# Patient Record
Sex: Female | Born: 2000 | Race: White | Hispanic: No | Marital: Single | Smoking: Never smoker
Health system: Southern US, Community
[De-identification: ages and names within clinical notes are randomized; demographics above are authoritative.]

---
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
## 2019-03-13 ENCOUNTER — Other Ambulatory Visit: Payer: Self-pay | Admitting: Adult Health

## 2019-03-13 ENCOUNTER — Telehealth: Payer: Self-pay | Admitting: Adult Health

## 2019-03-13 MED ORDER — LO LOESTRIN FE 1 MG-10 MCG / 10 MCG PO TABS: 1.0000 | ORAL_TABLET | Freq: Every day | ORAL | 3 refills | Status: DC

## 2019-03-13 NOTE — Telephone Encounter (Signed)
Patient is wanting to discuss changing bc, stated with what she's on she has a cycle every other week.  [employer]  [phone]

## 2019-03-13 NOTE — Telephone Encounter (Signed)
Has missed some pills nad had BTB, try to take every day, will refill lo loestrin.

## 2019-03-13 NOTE — Telephone Encounter (Signed)
Pt is on Lo Loestrin and is having a period every other week. Has missed some pills but pt states when she has missed pills before, it hasn't messed with period. Pt don't have a new sex partner. Has noticed this for about a month. Please advise. Thanks!! [place]

## 2019-05-23 ENCOUNTER — Other Ambulatory Visit: Payer: Self-pay

## 2019-05-23 DIAGNOSIS — Z20822 Contact with and (suspected) exposure to covid-19: Secondary | ICD-10-CM

## 2019-05-24 LAB — NOVEL CORONAVIRUS, NAA: SARS-CoV-2, NAA: NOT DETECTED

## 2019-05-26 ENCOUNTER — Telehealth: Payer: Self-pay | Admitting: General Practice

## 2019-05-26 NOTE — Telephone Encounter (Signed)
Negative COVID results given. Patient results "NOT Detected." Caller expressed understanding. ° °

## 2019-06-30 ENCOUNTER — Telehealth (INDEPENDENT_AMBULATORY_CARE_PROVIDER_SITE_OTHER): Payer: No Typology Code available for payment source | Admitting: Advanced Practice Midwife

## 2019-06-30 ENCOUNTER — Other Ambulatory Visit: Payer: Self-pay

## 2019-06-30 ENCOUNTER — Encounter: Payer: Self-pay | Admitting: Advanced Practice Midwife

## 2019-06-30 VITALS — Ht 65.0 in

## 2019-06-30 DIAGNOSIS — Z308 Encounter for other contraceptive management: Secondary | ICD-10-CM

## 2019-06-30 MED ORDER — MISOPROSTOL 200 MCG PO TABS
ORAL_TABLET | ORAL | 2 refills | Status: DC
Start: 2019-06-30 — End: 2021-05-12

## 2019-06-30 NOTE — Progress Notes (Signed)
   TELEHEALTH VIRTUAL GYNECOLOGY VISIT ENCOUNTER NOTE  I connected with [name] on 06/30/19 at  1:50 PM EST by telephone at home and verified that I am speaking with the correct person using two identifiers.   I discussed the limitations, risks, security and privacy concerns of performing an evaluation and management service by telephone and the availability of in person appointments. I also discussed with the patient that there may be a patient responsible charge related to this service. The patient expressed understanding and agreed to proceed.   History:  Stephanie Rich is a 18 y.o. G0P0000 female being evaluated today for contraception discussion.  She can't remember to take her pills everyday, doesn't want to gain weight, and doesn't want anything in her arm. She is interested in an IUD.  Discussed potential for challenging insertion, will try. Discussed other options, maybe nuva ring if this doesn't work out. SE/Risks/benefits.       History reviewed. No pertinent past medical history. Past Surgical History:  Procedure Laterality Date  . Stephanie Rich EXTRACTION  2019   The following portions of the patient's history were reviewed and updated as appropriate: allergies, current medications, past family history, past medical history, past social history, past surgical history and problem list.     Review of Systems:  Pertinent items noted in HPI and remainder of comprehensive ROS otherwise negative.  Physical Exam:   General:  Alert, oriented and cooperative.   Mental Status: Normal mood and affect perceived. Normal judgment and thought content.  Physical exam deferred due to nature of the encounter  Labs and Imaging No results found for this or any previous visit (from the past 336 hour(s)). No results found.    Assessment and Plan:     Contraception mgt Plan [place] IUD for next period (should be around 12/15).  Condoms until then, knows about plan B.  PRemedicate  w/cytotec.       I discussed the assessment and treatment plan with the patient. The patient was provided an opportunity to ask questions and all were answered. The patient agreed with the plan and demonstrated an understanding of the instructions.   The patient was advised to call back or seek an in-person evaluation/go to the ED if the symptoms worsen or if the condition fails to improve as anticipated.  I provided 15 minutes of non-face-to-face time during this encounter.   Christin Fudge, [place] for [employer], [place]

## 2019-06-30 NOTE — Patient Instructions (Signed)

## 2019-07-24 ENCOUNTER — Other Ambulatory Visit: Payer: Self-pay

## 2019-07-24 ENCOUNTER — Ambulatory Visit (INDEPENDENT_AMBULATORY_CARE_PROVIDER_SITE_OTHER): Payer: No Typology Code available for payment source | Admitting: Advanced Practice Midwife

## 2019-07-24 ENCOUNTER — Encounter: Payer: Self-pay | Admitting: Advanced Practice Midwife

## 2019-07-24 VITALS — BP 116/77 | HR 93 | Ht 65.0 in | Wt 146.0 lb

## 2019-07-24 DIAGNOSIS — Z30430 Encounter for insertion of intrauterine contraceptive device: Secondary | ICD-10-CM

## 2019-07-24 DIAGNOSIS — Z3202 Encounter for pregnancy test, result negative: Secondary | ICD-10-CM

## 2019-07-24 LAB — POCT URINE PREGNANCY: Preg Test, Ur: NEGATIVE

## 2019-07-24 MED ORDER — LEVONORGESTREL 19.5 MCG/DAY IU IUD: INTRAUTERINE_SYSTEM | Freq: Once | INTRAUTERINE | Status: AC

## 2019-07-24 NOTE — Progress Notes (Signed)
Stephanie Rich is a 18 y.o. year old  female G0  who presents for placement of a Liletta IUD. Her LMP was 12/16 and her pregnancy test today is negative.     The risks and benefits of the method and placement have been thouroughly reviewed with the patient and all questions were answered.  Specifically the patient is aware of failure rate of 08/998, expulsion of the IUD and of possible perforation.  The patient is aware of irregular bleeding due to the method and understands the incidence of irregular bleeding diminishes with time.  Time out was performed.  A Graves speculum was placed.  The cervix was prepped using Betadine. The uterus was found to be retroflexed and it sounded to 7 cm.  The cervix was grasped with a tenaculum and the IUD was inserted to 7 cm.  It was pulled back 1 cm and the IUD was disengaged.  The strings were trimmed to 3 cm. When I took the tennaculum off, the IUD was protruding through the cx.  The IUD was removed, strings only about 3cms long, so clearly the IUD did not stay disengaged during inserter removal . Another IUD was placed.   Sonogram was performed and the proper placement of the IUD was verified.  The patient was instructed on signs and symptoms of infection and to check for the strings after each menses or each month.  The patient is to refrain from intercourse for 3 days.  The patient is scheduled for a return appointment after her first menses or 4 weeks.  Christin Fudge 07/24/2019 4:31 PM

## 2019-07-24 NOTE — Addendum Note (Signed)
Addended by: Linton Rump on: 07/24/2019 05:01 PM   Modules accepted: Orders

## 2019-08-21 ENCOUNTER — Encounter: Payer: Self-pay | Admitting: Advanced Practice Midwife

## 2019-08-21 ENCOUNTER — Other Ambulatory Visit: Payer: Self-pay

## 2019-08-21 ENCOUNTER — Ambulatory Visit: Payer: No Typology Code available for payment source | Admitting: Advanced Practice Midwife

## 2019-08-21 VITALS — BP 109/73 | HR 120 | Ht 65.0 in | Wt 144.4 lb

## 2019-08-21 DIAGNOSIS — Z30431 Encounter for routine checking of intrauterine contraceptive device: Secondary | ICD-10-CM

## 2019-08-21 NOTE — Progress Notes (Signed)
History:  19 y.o. G0P0000 here today for today for IUD string check; Liletta IUD was placed  07/23/20. No complaints about the IUD, no concerning side effects.  The following portions of the patient's history were reviewed and updated as appropriate: allergies, current medications, past family history, past medical history, past social history, past surgical history and problem list.  Review of Systems:   Constitutional: Negative for fever and chills Eyes: Negative for visual disturbances Respiratory: Negative for shortness of breath, dyspnea Cardiovascular: Negative for chest pain or palpitations  Gastrointestinal: Negative for vomiting, diarrhea and constipation Genitourinary: Negative for dysuria and urgency Musculoskeletal: Negative for back pain, joint pain, myalgias  Neurological: Negative for dizziness and headaches    Objective:  Physical Exam Last menstrual period 07/23/2019. Gen: NAD Abd: Soft, nontender and nondistended Pelvic: Bedside US reveals properly place IUD. Boyfriend can feel strings, not bothersome  Assessment & Plan:  Normal IUD check. Patient to keep IUD in place for 6 years; can come in for removal if she desires pregnancy.

## 2019-08-30 ENCOUNTER — Encounter: Payer: Self-pay | Admitting: Advanced Practice Midwife

## 2019-09-01 ENCOUNTER — Encounter: Payer: Self-pay | Admitting: Advanced Practice Midwife

## 2019-09-01 ENCOUNTER — Telehealth: Payer: Self-pay | Admitting: *Deleted

## 2019-09-01 NOTE — Telephone Encounter (Signed)
Erroneous encounter

## 2019-09-03 ENCOUNTER — Ambulatory Visit: Payer: Self-pay | Admitting: Obstetrics and Gynecology

## 2020-02-20 ENCOUNTER — Telehealth: Payer: Self-pay | Admitting: Obstetrics & Gynecology

## 2020-02-20 MED ORDER — NITROFURANTOIN MONOHYD MACRO 100 MG PO CAPS: 100.0000 mg | ORAL_CAPSULE | Freq: Two times a day (BID) | ORAL | 0 refills | Status: DC

## 2021-05-12 ENCOUNTER — Other Ambulatory Visit: Payer: Self-pay

## 2021-05-12 ENCOUNTER — Ambulatory Visit: Payer: Self-pay | Admitting: Adult Health

## 2021-05-12 ENCOUNTER — Encounter: Payer: Self-pay | Admitting: Adult Health

## 2021-05-12 VITALS — BP 114/82 | HR 106 | Ht 65.0 in | Wt 171.0 lb

## 2021-05-12 DIAGNOSIS — Z30432 Encounter for removal of intrauterine contraceptive device: Secondary | ICD-10-CM | POA: Insufficient documentation

## 2021-05-12 NOTE — Progress Notes (Signed)
  Subjective:     Patient ID: Stephanie Rich, female   DOB: [birth year], 20 y.o.   MRN: [id number]  HPI Stephanie Rich is a 33 year old white female, single, G0P0, in requesting IUD removal, has bad cramps, irregular periods and pain with sex. She is self pay.    Review of Systems Has bad cramps with IUD Periods irregular with IUD Has pain with sex Reviewed past medical,surgical, social and family history. Reviewed medications and allergies.     Objective:   Physical Exam BP 114/82 (BP Location: Left Arm, Patient Position: Sitting, Cuff Size: Normal)   Pulse (!) 106   Ht 5\' 5"  (1.651 m)   Wt 171 lb (77.6 kg)   LMP  (LMP Unknown)   BMI 28.46 kg/m  Consent signed for IUD removal.   Skin warm and dry.Pelvic: external genitalia is normal in appearance no lesions, vagina: pink and moist,urethra has no lesions or masses noted, cervix:smooth,+IUD strings at os, strings grasped with forceps and pt asked to cough and IUD easily removed,uterus: normal size, shape and contour, non tender, no masses felt, adnexa: no masses or tenderness noted. Bladder is non tender and no masses felt.   Examination chaperoned by LPN  [place] - 05/12/21 1008       Pregnancy Intention Screening   Does the patient want to become pregnant in the next year? No    Does the patient's partner want to become pregnant in the next year? No    Would the patient like to discuss contraceptive options today? No      Contraception Wrap Up   Current Method IUD or IUS    End Method Female Condom    Contraception Counseling Provided No            She is aware of Plan B  Assessment:     1. Encounter for IUD removal    Plan:     Return in about 3 months for pap and physical, will check GC/CHL then  Apply for [facility], or will try to get insurance

## 2021-05-25 ENCOUNTER — Encounter: Payer: Self-pay | Admitting: Obstetrics & Gynecology

## 2021-08-12 ENCOUNTER — Other Ambulatory Visit: Payer: Self-pay | Admitting: Adult Health

## 2022-04-07 ENCOUNTER — Encounter: Payer: Self-pay | Admitting: Obstetrics & Gynecology

## 2022-04-07 ENCOUNTER — Ambulatory Visit (INDEPENDENT_AMBULATORY_CARE_PROVIDER_SITE_OTHER): Payer: Medicaid Other | Admitting: Obstetrics & Gynecology

## 2022-04-07 ENCOUNTER — Other Ambulatory Visit (HOSPITAL_COMMUNITY)
Admission: RE | Admit: 2022-04-07 | Discharge: 2022-04-07 | Disposition: A | Discharge status: Home / Self Care | Payer: Medicaid Other | Source: Ambulatory Visit | Attending: Obstetrics & Gynecology | Admitting: Obstetrics & Gynecology

## 2022-04-07 VITALS — BP 113/74 | HR 81 | Ht 65.0 in | Wt 168.2 lb

## 2022-04-07 DIAGNOSIS — Z30011 Encounter for initial prescription of contraceptive pills: Secondary | ICD-10-CM | POA: Diagnosis not present

## 2022-04-07 DIAGNOSIS — N946 Dysmenorrhea, unspecified: Secondary | ICD-10-CM

## 2022-04-07 DIAGNOSIS — N941 Unspecified dyspareunia: Secondary | ICD-10-CM | POA: Diagnosis not present

## 2022-04-07 DIAGNOSIS — N920 Excessive and frequent menstruation with regular cycle: Secondary | ICD-10-CM

## 2022-04-07 DIAGNOSIS — Z01419 Encounter for gynecological examination (general) (routine) without abnormal findings: Secondary | ICD-10-CM

## 2022-04-07 DIAGNOSIS — Z3009 Encounter for other general counseling and advice on contraception: Secondary | ICD-10-CM | POA: Diagnosis not present

## 2022-04-07 DIAGNOSIS — Z113 Encounter for screening for infections with a predominantly sexual mode of transmission: Secondary | ICD-10-CM | POA: Diagnosis not present

## 2022-04-07 DIAGNOSIS — R102 Pelvic and perineal pain: Secondary | ICD-10-CM

## 2022-04-07 MED ORDER — NORETHIN-ETH ESTRAD-FE BIPHAS 1 MG-10 MCG / 10 MCG PO TABS: 1.0000 | ORAL_TABLET | Freq: Every day | ORAL | 4 refills | Status: DC

## 2022-04-07 NOTE — Progress Notes (Signed)
WELL-WOMAN EXAMINATION Patient name: Stephanie Rich MRN [id number]  Date of birth: 2001/03/02 Chief Complaint:   Gynecologic Exam  History of Present Illness:   Stephanie Rich is a 21 y.o. G0P0000 female being seen today for a routine well-woman exam.   -Concern for endometriosis:  Issues with period since 21yo.  Previously on OCPs; however, difficulty with compliance. Previously had IUD for contraception- removed due to pelvic pain and AUB.    Menses are monthly.  Heavy for the first 2-3 days  changing super tampon every 2hr then "normal" day using tampon every 4 hours.  Notes both dysmenorrhea and cramping not even on her period.  +Dyspareunia- most of the time.  On occasion will take medication, but tries not to.  Also notes fatigue as well as constipation.   -Sexually active using condoms  Patient's last menstrual period was 03/20/2022.  The current method of family planning is condoms.    Last pap to be collected today.  Last mammogram: n/a. Last colonoscopy: n/a     04/07/2022    9:48 AM 08/15/2016    3:53 PM 05/15/2016    4:08 PM  Depression screen PHQ 2/9  Decreased Interest 1 0 0  Down, Depressed, Hopeless 0 0 0  PHQ - 2 Score 1 0 0  Altered sleeping 1 0 0  Tired, decreased energy 2 0 0  Change in appetite 1 0 0  Feeling bad or failure about yourself  0 0 0  Trouble concentrating 1 0 0  Moving slowly or fidgety/restless 0 0 0  Suicidal thoughts 0 0 0  PHQ-9 Score 6 0 0      Review of Systems:   Pertinent items are noted in HPI Denies any headaches, blurred vision, fatigue, shortness of breath, chest pain, abdominal pain, bowel movements, urination, or intercourse unless otherwise stated above.  Pertinent History Reviewed:  Reviewed past medical,surgical, social and family history.  Reviewed problem list, medications and allergies. Physical Assessment:   Vitals:   04/07/22 0942  BP: 113/74  Pulse: 81  Weight: 168 lb 3.2 oz (76.3 kg)  Height: 5'  5" (1.651 m)  Body mass index is 27.99 kg/m.        Physical Examination:   General appearance - well appearing, and in no distress  Mental status - alert, oriented to person, place, and time  Psych:  She has a normal mood and affect  Skin - warm and dry, normal color, no suspicious lesions noted  Chest - effort normal, all lung fields clear to auscultation bilaterally  Heart - normal rate and regular rhythm  Neck:  midline trachea, no thyromegaly or nodules  Breasts - breasts appear normal, no suspicious masses, no skin or nipple changes or  axillary nodes  Abdomen - soft, nontender, nondistended, no masses or organomegaly  Pelvic - VULVA: normal appearing vulva with no masses, tenderness or lesions  VAGINA: normal appearing vagina with normal color and discharge, no lesions  CERVIX: normal appearing cervix without discharge or lesions, no CMT  Thin prep pap is done with HR HPV cotesting  UTERUS: uterus is felt to be normal size, shape, consistency and nontender   ADNEXA: No adnexal masses or tenderness noted.  Extremities:  No swelling or varicosities noted  Chaperone:  pt declined      Assessment & Plan:  1) Well-Woman Exam -pap collected, reviewed ASCCP guidelines  2) Contraceptive management -currently using condoms -plan to start OCPs  3) Pelvic pain, Dyspareunia, HMB-  concerns for endometriosis -Discussed diagnosis of endometriosis and symptoms -reviewed "gold standard" diagnosis vs management of symptoms -discussed concerns regarding future pregnancy -reviewed that until she is actively trying for 86yr would not recommend surgical intervention as women with endmetriosis can become pregnant on their own without intervention -discussed that disease progression/stage of endo does not match infertility  -reviewed fecundity and her concerns about already having infertility -plan for lab work to r/o underlying anemia due to HMB and fatigue   Orders Placed This Encounter   Procedures   CBC   Iron, TIBC and Ferritin Panel    Meds:  Meds ordered this encounter  Medications   Norethindrone-Ethinyl Estradiol-Fe Biphas (LO LOESTRIN FE) 1 MG-10 MCG / 10 MCG tablet    Sig: Take 1 tablet by mouth daily.    Dispense:  90 tablet    Refill:  4    Follow-up: Return in about 1 year (around 04/08/2023) for Annual.   Myna Hidalgo, DO Attending Obstetrician & Gynecologist, [facility] for [facility], [facility]

## 2022-04-17 LAB — CYTOLOGY - PAP
Chlamydia: NEGATIVE
Comment: NEGATIVE
Comment: NEGATIVE
Comment: NORMAL
Diagnosis: UNDETERMINED — AB
High risk HPV: NEGATIVE
Neisseria Gonorrhea: NEGATIVE

## 2022-04-20 ENCOUNTER — Encounter: Payer: Self-pay | Admitting: Obstetrics & Gynecology

## 2022-12-27 ENCOUNTER — Encounter: Payer: Self-pay | Admitting: Obstetrics & Gynecology

## 2022-12-28 ENCOUNTER — Other Ambulatory Visit: Payer: Self-pay | Admitting: Obstetrics & Gynecology

## 2022-12-28 DIAGNOSIS — Z30011 Encounter for initial prescription of contraceptive pills: Secondary | ICD-10-CM

## 2022-12-28 MED ORDER — NORETHINDRONE ACET-ETHINYL EST 1.5-30 MG-MCG PO TABS: 1.0000 | ORAL_TABLET | Freq: Every day | ORAL | 11 refills | Status: DC

## 2022-12-28 NOTE — Progress Notes (Signed)
Change to higher dose OCP  Myna Hidalgo, DO Attending Obstetrician & Gynecologist, [facility] for [employer], [facility]

## 2023-03-05 ENCOUNTER — Encounter: Payer: Self-pay | Admitting: Obstetrics & Gynecology

## 2023-10-17 DIAGNOSIS — Z419 Encounter for procedure for purposes other than remedying health state, unspecified: Secondary | ICD-10-CM | POA: Diagnosis not present

## 2023-10-27 ENCOUNTER — Other Ambulatory Visit: Payer: Self-pay

## 2023-10-27 ENCOUNTER — Inpatient Hospital Stay (HOSPITAL_COMMUNITY)
Admission: AD | Admit: 2023-10-27 | Discharge: 2023-10-27 | Disposition: A | Discharge status: Home / Self Care | Payer: Self-pay | Attending: Obstetrics and Gynecology | Admitting: Obstetrics and Gynecology

## 2023-10-27 ENCOUNTER — Inpatient Hospital Stay (HOSPITAL_COMMUNITY): Payer: Self-pay

## 2023-10-27 DIAGNOSIS — Z3A00 Weeks of gestation of pregnancy not specified: Secondary | ICD-10-CM | POA: Insufficient documentation

## 2023-10-27 DIAGNOSIS — O3680X0 Pregnancy with inconclusive fetal viability, not applicable or unspecified: Secondary | ICD-10-CM

## 2023-10-27 DIAGNOSIS — R1030 Lower abdominal pain, unspecified: Secondary | ICD-10-CM | POA: Diagnosis not present

## 2023-10-27 DIAGNOSIS — O3480 Maternal care for other abnormalities of pelvic organs, unspecified trimester: Secondary | ICD-10-CM | POA: Diagnosis not present

## 2023-10-27 DIAGNOSIS — N939 Abnormal uterine and vaginal bleeding, unspecified: Secondary | ICD-10-CM

## 2023-10-27 DIAGNOSIS — Z3A01 Less than 8 weeks gestation of pregnancy: Secondary | ICD-10-CM

## 2023-10-27 DIAGNOSIS — O209 Hemorrhage in early pregnancy, unspecified: Secondary | ICD-10-CM | POA: Diagnosis not present

## 2023-10-27 DIAGNOSIS — N854 Malposition of uterus: Secondary | ICD-10-CM | POA: Diagnosis not present

## 2023-10-27 LAB — COMPREHENSIVE METABOLIC PANEL
ALT: 18 U/L (ref 0–44)
AST: 17 U/L (ref 15–41)
Albumin: 4 g/dL (ref 3.5–5.0)
Alkaline Phosphatase: 37 U/L — ABNORMAL LOW (ref 38–126)
Anion gap: 8 (ref 5–15)
BUN: 8 mg/dL (ref 6–20)
CO2: 23 mmol/L (ref 22–32)
Calcium: 9 mg/dL (ref 8.9–10.3)
Chloride: 108 mmol/L (ref 98–111)
Creatinine, Ser: 0.7 mg/dL (ref 0.44–1.00)
GFR, Estimated: >60 mL/min (ref >60)
Glucose, Bld: 105 mg/dL — ABNORMAL HIGH (ref 70–99)
Potassium: 3.8 mmol/L (ref 3.5–5.1)
Sodium: 139 mmol/L (ref 135–145)
Total Bilirubin: 0.5 mg/dL (ref 0.0–1.2)
Total Protein: 6.3 g/dL — ABNORMAL LOW (ref 6.5–8.1)

## 2023-10-27 LAB — CBC
HCT: 38.5 % (ref 36.0–46.0)
Hemoglobin: 12.9 g/dL (ref 12.0–15.0)
MCH: 28.7 pg (ref 26.0–34.0)
MCHC: 33.5 g/dL (ref 30.0–36.0)
MCV: 85.6 fL (ref 80.0–100.0)
Platelets: 274 10*3/uL (ref 150–400)
RBC: 4.5 MIL/uL (ref 3.87–5.11)
RDW: 13.1 % (ref 11.5–15.5)
WBC: 7 10*3/uL (ref 4.0–10.5)
nRBC: 0 % (ref 0.0–0.2)

## 2023-10-27 LAB — URINALYSIS, ROUTINE W REFLEX MICROSCOPIC
Bilirubin Urine: NEGATIVE
Glucose, UA: NEGATIVE mg/dL
Hgb urine dipstick: NEGATIVE
Ketones, ur: NEGATIVE mg/dL
Leukocytes,Ua: NEGATIVE
Nitrite: NEGATIVE
Protein, ur: NEGATIVE mg/dL
Specific Gravity, Urine: 1.01 (ref 1.005–1.030)
pH: 7 (ref 5.0–8.0)

## 2023-10-27 LAB — WET PREP, GENITAL
Clue Cells Wet Prep HPF POC: NONE SEEN
Sperm: NONE SEEN
Trich, Wet Prep: NONE SEEN
WBC, Wet Prep HPF POC: <10 (ref <10)
Yeast Wet Prep HPF POC: NONE SEEN

## 2023-10-27 LAB — HCG, QUANTITATIVE, PREGNANCY: hCG, Beta Chain, Quant, S: 15 m[IU]/mL — ABNORMAL HIGH (ref <5)

## 2023-10-27 LAB — POCT PREGNANCY, URINE: Preg Test, Ur: NEGATIVE

## 2023-10-27 MED ORDER — PREPLUS 27-1 MG PO TABS: 1.0000 | ORAL_TABLET | Freq: Every day | ORAL | 13 refills | Status: AC

## 2023-10-27 NOTE — MAU Provider Note (Signed)
 VB, abd cramping, +HPT  S Ms. Stephanie Rich is a 23 y.o. G0P0000 female who presents to MAU today with complaint of +HPT on 3/16 but now with moderate amounts of bright red VB and mild lower abdominal cramping that started 3/21 around 1600. Pt states she had a tampon in her vaginal over night with only minimal soaking however after being roomed her in MAU she saw blood already on pad and tissue like membranes, her first experience with such discharge.  States besides tampon the last coitus or vaginal penetration was over 1week ago.    Receives care at [facility]. Prenatal records reviewed.  Pertinent items noted in HPI and remainder of comprehensive ROS otherwise negative.   O BP (!) 96/46 (BP Location: Right Arm)   Pulse 86   Temp 98.3 F (36.8 C) (Oral)   Resp 18   Ht (P) 5\' 5"  (1.651 m)   Wt (P) 70.4 kg   SpO2 100%   BMI (P) 25.83 kg/m  Physical Exam Vitals and nursing note reviewed. Exam conducted with a chaperone present.  Constitutional:      General: She is not in acute distress.    Appearance: Normal appearance. She is normal weight. She is not ill-appearing.  HENT:     Head: Normocephalic and atraumatic.     Right Ear: External ear normal.     Left Ear: External ear normal.     Nose: Nose normal. No congestion.     Mouth/Throat:     Mouth: Mucous membranes are moist.     Pharynx: Oropharynx is clear.  Eyes:     Extraocular Movements: Extraocular movements intact.     Conjunctiva/sclera: Conjunctivae normal.  Cardiovascular:     Rate and Rhythm: Normal rate.  Pulmonary:     Effort: Pulmonary effort is normal. No respiratory distress.  Abdominal:     General: Abdomen is flat. There is no distension.     Palpations: Abdomen is soft.  Genitourinary:    General: Normal vulva.     Vagina: Vaginal discharge (bright red blood and membranous dc) present.     Comments: External os about FT dilated though cervix appears full and with trailing membranes from external os, unable  to gather any tissues with ring forceps  Musculoskeletal:        General: No swelling. Normal range of motion.     Cervical back: Normal range of motion.  Skin:    General: Skin is warm and dry.  Neurological:     Mental Status: She is alert and oriented to person, place, and time. Mental status is at baseline.     Motor: No weakness.     Gait: Gait normal.  Psychiatric:        Mood and Affect: Mood normal.        Behavior: Behavior normal.      MDM: MAU Course: UA negative, no hematuria  Upreg negative hCG 15  GC collected Wet prep negative   CBC wnl  CMP reassuring  ABO/Rh A+, no need for rhogam   [place] pregnancy of unknown location  AP #Pregnancy of Unknown Location #Hx of +HPT #Vaginal bleeding Concerning for SAB given low hCG as well as membranous tissue collected on admission.  However, given no priors to trend to confirm will have pt get repeat hCG at FT this coming week and sent message to arrange scheduling both lab draw and follow up to discuss ?SAB. Will also send off tissue collected for surgical path.  Discharge from MAU in stable condition with strict/usual precautions Follow up at FT as scheduled for ongoing prenatal care  Allergies as of 10/27/2023       Reactions   Latex Rash        Medication List     STOP taking these medications    Norethindrone Acetate-Ethinyl Estradiol 1.5-30 MG-MCG tablet Commonly known as: Loestrin 1.5/30 (21)        Hessie Dibble, MD 10/27/2023 2:13 PM

## 2023-10-27 NOTE — MAU Note (Signed)
 Stephanie Rich is a 23 y.o. at Unknown here in MAU reporting: multiple positive home pregnancy tests, first one 10/21/2023. Patient reports having small to moderate amounts of bright red vaginal bleeding and mild lower abdominal cramping that started yesterday around 1600.  LMP: 09/21/2023 Onset of complaint: 1600 10/26/2023 Pain score: 1 Vitals:   10/27/23 1153  BP: (!) 96/46  Pulse: 86  Resp: 18  Temp: 98.3 F (36.8 C)  SpO2: 100%   Lab orders placed from triage: urine pregnancy test

## 2023-10-28 LAB — ABO/RH: ABO/RH(D): A POS

## 2023-10-29 ENCOUNTER — Encounter: Payer: Self-pay | Admitting: Adult Health

## 2023-10-29 ENCOUNTER — Ambulatory Visit: Admitting: Adult Health

## 2023-10-29 VITALS — BP 101/63 | HR 67 | Ht 65.0 in | Wt 153.0 lb

## 2023-10-29 DIAGNOSIS — Z3A01 Less than 8 weeks gestation of pregnancy: Secondary | ICD-10-CM | POA: Diagnosis not present

## 2023-10-29 DIAGNOSIS — N939 Abnormal uterine and vaginal bleeding, unspecified: Secondary | ICD-10-CM

## 2023-10-29 DIAGNOSIS — O039 Complete or unspecified spontaneous abortion without complication: Secondary | ICD-10-CM | POA: Diagnosis not present

## 2023-10-29 LAB — GC/CHLAMYDIA PROBE AMP (~~LOC~~) NOT AT ARMC
Chlamydia: NEGATIVE
Comment: NEGATIVE
Comment: NORMAL
Neisseria Gonorrhea: NEGATIVE

## 2023-10-29 NOTE — Progress Notes (Signed)
  Subjective:     Patient ID: Stephanie Rich, female   DOB: [birth year], 23 y.o.   MRN: [id number]  HPI Stephanie Rich is a 23 year old white female, single, G1P0, in for follow up on MAU visit 10/26/23 for vaginal bleeding that started night before and passed tissue, in MAU.  Has first +HPT 10/21/23.     Component Value Date/Time   DIAGPAP (A) 04/07/2022 0939    - Atypical squamous cells of undetermined significance (ASC-US)   HPVHIGH Negative 04/07/2022 0939   ADEQPAP  04/07/2022 0939    Satisfactory for evaluation; transformation zone component PRESENT.     Review of Systems Still bleeding Reviewed past medical,surgical, social and family history. Reviewed medications and allergies.     Objective:   Physical Exam BP 101/63 (BP Location: Right Arm, Patient Position: Sitting, Cuff Size: Normal)   Pulse 67   Ht 5\' 5"  (1.651 m)   Wt 153 lb (69.4 kg)   LMP  (LMP Unknown)   BMI 25.46 kg/m  Skin warm and dry.Lungs: clear to ausculation bilaterally. Cardiovascular: regular rate and rhythm.    Fall risk is low  Upstream - 10/29/23 1336       Pregnancy Intention Screening   Does the patient want to become pregnant in the next year? N/A    Does the patient's partner want to become pregnant in the next year? N/A    Would the patient like to discuss contraceptive options today? No      Contraception Wrap Up   Current Method Pregnant/Seeking Pregnancy    End Method Pregnant/Seeking Pregnancy    Contraception Counseling Provided No             Assessment:     1. Vaginal bleeding (Primary) Bleeding started Friday night, seen in MAU 10/26/23 QHCG was 15, US showed no IUP or ectopic Passed tissue and that was sent to pathology Will check QHCG now and schedule [place] in 10 days for recheck - US PELVIC COMPLETE WITH TRANSVAGINAL; Future - Beta hCG quant (ref lab)  2. Miscarriage ?miscarriage, still bleeding  Blood type A+ Will check QHCG today and get pelvic US in 10 days  - US PELVIC  COMPLETE WITH TRANSVAGINAL; Future - Beta hCG quant (ref lab)     Plan:     Follow up in 10 days in office for pelvic US

## 2023-10-30 LAB — BETA HCG QUANT (REF LAB): hCG Quant: 4 m[IU]/mL

## 2023-10-30 LAB — SURGICAL PATHOLOGY

## 2023-11-08 ENCOUNTER — Telehealth: Payer: Self-pay | Admitting: Obstetrics & Gynecology

## 2023-11-08 ENCOUNTER — Encounter: Payer: Self-pay | Admitting: Obstetrics & Gynecology

## 2023-11-08 NOTE — Telephone Encounter (Signed)
-  Called patient back and reviewed that ultrasound is canceled -She notes that she has not had any further bleeding -Discussed that with following hCG no further workup indicated -Patient is not sure whether she is was not trying for pregnancy however she does have COC's if desired -Reassured patient that next menses may be slightly heavier and more dysmenorrhea -Questions and concerns were addressed -Plan to follow-up as needed  Myna Hidalgo, DO Attending Obstetrician & Gynecologist, [facility] [employer], [facility]

## 2023-11-09 ENCOUNTER — Other Ambulatory Visit

## 2023-11-17 DIAGNOSIS — Z419 Encounter for procedure for purposes other than remedying health state, unspecified: Secondary | ICD-10-CM | POA: Diagnosis not present

## 2023-12-17 DIAGNOSIS — Z419 Encounter for procedure for purposes other than remedying health state, unspecified: Secondary | ICD-10-CM | POA: Diagnosis not present

## 2024-01-17 DIAGNOSIS — Z419 Encounter for procedure for purposes other than remedying health state, unspecified: Secondary | ICD-10-CM | POA: Diagnosis not present

## 2024-02-16 DIAGNOSIS — Z419 Encounter for procedure for purposes other than remedying health state, unspecified: Secondary | ICD-10-CM | POA: Diagnosis not present

## 2024-03-18 DIAGNOSIS — Z419 Encounter for procedure for purposes other than remedying health state, unspecified: Secondary | ICD-10-CM | POA: Diagnosis not present

## 2024-04-18 DIAGNOSIS — Z419 Encounter for procedure for purposes other than remedying health state, unspecified: Secondary | ICD-10-CM | POA: Diagnosis not present

## 2024-06-04 ENCOUNTER — Ambulatory Visit

## 2024-06-04 DIAGNOSIS — Z3201 Encounter for pregnancy test, result positive: Secondary | ICD-10-CM

## 2024-06-04 LAB — POCT URINE PREGNANCY: Preg Test, Ur: POSITIVE — AB

## 2024-06-04 NOTE — Progress Notes (Signed)
   NURSE VISIT- PREGNANCY CONFIRMATION   SUBJECTIVE:  Stephanie Rich is a 23 y.o. G1P0000 female at [redacted]w[redacted]d by certain LMP of Patient's last menstrual period was 04/24/2024. Here for pregnancy confirmation.  Home pregnancy test: positive x 3  She reports no complaints.  She is taking prenatal vitamins.    OBJECTIVE:  LMP 04/24/2024   Appears well, in no apparent distress  No results found for this or any previous visit (from the past 24 hours).  ASSESSMENT: Positive pregnancy test, [redacted]w[redacted]d by LMP    PLAN: Schedule for dating ultrasound in 2 weeks Prenatal vitamins: continue   Nausea medicines: not currently needed   OB packet given: No  Alan LITTIE Fischer  06/04/2024 10:01 AM

## 2024-06-18 ENCOUNTER — Ambulatory Visit (INDEPENDENT_AMBULATORY_CARE_PROVIDER_SITE_OTHER)

## 2024-06-18 DIAGNOSIS — O3680X0 Pregnancy with inconclusive fetal viability, not applicable or unspecified: Secondary | ICD-10-CM | POA: Diagnosis not present

## 2024-06-18 DIAGNOSIS — N939 Abnormal uterine and vaginal bleeding, unspecified: Secondary | ICD-10-CM

## 2024-06-18 DIAGNOSIS — O039 Complete or unspecified spontaneous abortion without complication: Secondary | ICD-10-CM

## 2024-06-18 DIAGNOSIS — Z3A01 Less than 8 weeks gestation of pregnancy: Secondary | ICD-10-CM

## 2024-06-18 NOTE — Progress Notes (Signed)
 US  7+6 wks,single IUP with yolk sac,FHR 155 bpm,normal ovaries,CRL 13.54 mm

## 2024-06-23 ENCOUNTER — Ambulatory Visit: Payer: Self-pay | Admitting: Adult Health

## 2024-07-23 ENCOUNTER — Other Ambulatory Visit (HOSPITAL_COMMUNITY)
Admission: RE | Admit: 2024-07-23 | Discharge: 2024-07-23 | Disposition: A | Source: Ambulatory Visit | Attending: Women's Health | Admitting: Women's Health

## 2024-07-23 ENCOUNTER — Encounter: Payer: Self-pay | Admitting: Women's Health

## 2024-07-23 ENCOUNTER — Encounter: Admitting: *Deleted

## 2024-07-23 ENCOUNTER — Ambulatory Visit: Admitting: Women's Health

## 2024-07-23 VITALS — BP 112/77 | HR 98 | Wt 163.0 lb

## 2024-07-23 DIAGNOSIS — Z3490 Encounter for supervision of normal pregnancy, unspecified, unspecified trimester: Secondary | ICD-10-CM | POA: Insufficient documentation

## 2024-07-23 DIAGNOSIS — Z8742 Personal history of other diseases of the female genital tract: Secondary | ICD-10-CM | POA: Insufficient documentation

## 2024-07-23 DIAGNOSIS — Z3A12 12 weeks gestation of pregnancy: Secondary | ICD-10-CM | POA: Diagnosis not present

## 2024-07-23 DIAGNOSIS — Z113 Encounter for screening for infections with a predominantly sexual mode of transmission: Secondary | ICD-10-CM | POA: Diagnosis not present

## 2024-07-23 DIAGNOSIS — Z131 Encounter for screening for diabetes mellitus: Secondary | ICD-10-CM

## 2024-07-23 DIAGNOSIS — Z3480 Encounter for supervision of other normal pregnancy, unspecified trimester: Secondary | ICD-10-CM

## 2024-07-23 DIAGNOSIS — Z1332 Encounter for screening for maternal depression: Secondary | ICD-10-CM

## 2024-07-23 DIAGNOSIS — Z8759 Personal history of other complications of pregnancy, childbirth and the puerperium: Secondary | ICD-10-CM | POA: Diagnosis not present

## 2024-07-23 DIAGNOSIS — Z3481 Encounter for supervision of other normal pregnancy, first trimester: Secondary | ICD-10-CM | POA: Diagnosis not present

## 2024-07-23 NOTE — Progress Notes (Signed)
 INITIAL OBSTETRICAL VISIT Patient name: Stephanie Rich MRN [id number]  Date of birth: [birth year] Chief Complaint:   Initial Prenatal Visit  History of Present Illness:   Stephanie Rich is a 23 y.o. G30P0010 Caucasian female at [redacted]w[redacted]d by LMP c/w u/s at 7 weeks with an Estimated Date of Delivery: 01/29/25 being seen today for her initial obstetrical visit.   Patient's last menstrual period was 04/24/2024. Her obstetrical history is significant for SAB x 1.   Today she reports no complaints.  Last pap 04/07/22. Results were: ASCUS w/ HRHPV negative     07/23/2024    9:44 AM 04/07/2022    9:48 AM 08/15/2016    3:53 PM 05/15/2016    4:08 PM  Depression screen PHQ 2/9  Decreased Interest 0 1 0 0  Down, Depressed, Hopeless 0 0 0 0  PHQ - 2 Score 0 1 0 0  Altered sleeping 0 1 0 0  Tired, decreased energy 0 2 0 0  Change in appetite 0 1 0 0  Feeling bad or failure about yourself  0 0 0 0  Trouble concentrating 0 1 0 0  Moving slowly or fidgety/restless 0 0 0 0  Suicidal thoughts 0 0 0  0   PHQ-9 Score 0 6  0  0      Data saved with a previous flowsheet row definition        07/23/2024    9:44 AM 04/07/2022    9:48 AM  GAD 7 : Generalized Anxiety Score  Nervous, Anxious, on Edge 0 1  Control/stop worrying 0 1  Worry too much - different things 0 1  Trouble relaxing 0 1  Restless 0 1  Easily annoyed or irritable 0 1  Afraid - awful might happen 0 0  Total GAD 7 Score 0 6     Review of Systems:   Pertinent items are noted in HPI Denies cramping/contractions, leakage of fluid, vaginal bleeding, abnormal vaginal discharge w/ itching/odor/irritation, headaches, visual changes, shortness of breath, chest pain, abdominal pain, severe nausea/vomiting, or problems with urination or bowel movements unless otherwise stated above.  Pertinent History Reviewed:  Reviewed past medical,surgical, social, obstetrical and family history.  Reviewed problem list, medications and allergies. OB  History  Gravida Para Term Preterm AB Living  2 0 0 0 1 0  SAB IAB Ectopic Multiple Live Births  1 0 0 0 0    # Outcome Date GA Lbr Len/2nd Weight Sex Type Anes PTL Lv  2 Current           1 SAB 10/30/23           Physical Assessment:   Vitals:   07/23/24 0934  BP: 112/77  Pulse: 98  Weight: 163 lb (73.9 kg)  Body mass index is 27.12 kg/m.       Physical Examination:  General appearance - well appearing, and in no distress  Mental status - alert, oriented to person, place, and time  Psych:  She has a normal mood and affect  Skin - warm and dry, normal color, no suspicious lesions noted  Chest - effort normal, all lung fields clear to auscultation bilaterally  Heart - normal rate and regular rhythm  Abdomen - soft, nontender  Extremities:  No swelling or varicosities noted  Thin prep pap is not done   Chaperone: N/A  TODAY'S informal TA u/s: +FCA and active fetus  No results found for this or any previous visit (from  the past 24 hours).  Assessment & Plan:  1) Low-Risk Pregnancy G2P0010 at [redacted]w[redacted]d with an Estimated Date of Delivery: 01/29/25   2) Initial OB visit  Meds: No orders of the defined types were placed in this encounter.   Initial labs obtained Continue prenatal vitamins Reviewed n/v relief measures and warning s/s to report Reviewed recommended weight gain based on pre-gravid BMI Encouraged well-balanced diet Genetic & carrier screening discussed: requests Panorama, AFP, and Horizon  Ultrasound discussed; fetal survey: requested CCNC completed> form faxed if has or is planning to apply for medicaid The nature of [facility] [employer] with multiple MDs and other Advanced Practice Providers was explained to patient; also emphasized that fellows, residents, and students are part of our team. Does not have home bp cuff. Office bp cuff given: no. Rx sent: yes. Check bp weekly, let us  know if consistently >140/90.   Follow-up: Return in  about 4 weeks (around 08/20/2024) for LROB, AFP, CNM, in person; then @ 20w for anatomy u/s and LROB w/ CNM.   Orders Placed This Encounter  Procedures   Urine Culture   CBC/D/Plt+RPR+Rh+ABO+RubIgG...   Hemoglobin A1c   PANORAMA PRENATAL TEST   HORIZON CUSTOM    Suzen JONELLE Fetters CNM, [facility] 07/23/2024 10:13 AM

## 2024-07-23 NOTE — Patient Instructions (Signed)
Allyssa, thank you for choosing our office today! We appreciate the opportunity to meet your healthcare needs. You may receive a short survey by mail, e-mail, or through MyChart. If you are happy with your care we would appreciate if you could take just a few minutes to complete the survey questions. We read all of your comments and take your feedback very seriously. Thank you again for choosing our office.  [facility] ([address] [place], [place] [address]) Entrance C, located off of [address] Free 24/7 valet parking   Nausea & Vomiting Have saltine crackers or pretzels by your bed and eat a few bites before you raise your head out of bed in the morning Eat small frequent meals throughout the day instead of large meals Drink plenty of fluids throughout the day to stay hydrated, just don't drink a lot of fluids with your meals.  This can make your stomach fill up faster making you feel sick Do not brush your teeth right after you eat Products with real ginger are good for nausea, like ginger ale and ginger hard candy Make sure it says made with real ginger! Sucking on sour candy like lemon heads is also good for nausea If your prenatal vitamins make you nauseated, take them at night so you will sleep through the nausea Sea Bands If you feel like you need medicine for the nausea & vomiting please let us know If you are unable to keep any fluids or food down please let us know   Constipation Drink plenty of fluid, preferably water, throughout the day Eat foods high in fiber such as fruits, vegetables, and grains Exercise, such as walking, is a good way to keep your bowels regular Drink warm fluids, especially warm prune juice, or decaf coffee Eat a 1/2 cup of real oatmeal (not instant), 1/2 cup applesauce, and 1/2-1 cup warm prune juice every day If needed, you may take Colace (docusate sodium) stool softener  once or twice a day to help keep the stool soft.  If you still are having problems with constipation, you may take Miralax once daily as needed to help keep your bowels regular.   Home Blood Pressure Monitoring for Patients   Your provider has recommended that you check your blood pressure (BP) at least once a week at home. If you do not have a blood pressure cuff at home, one will be provided for you. Contact your provider if you have not received your monitor within 1 week.   Helpful Tips for Accurate Home Blood Pressure Checks  Don't smoke, exercise, or drink caffeine 30 minutes before checking your BP Use the restroom before checking your BP (a full bladder can raise your pressure) Relax in a comfortable upright chair Feet on the ground Left arm resting comfortably on a flat surface at the level of your heart Legs uncrossed Back supported Sit quietly and don't talk Place the cuff on your bare arm Adjust snuggly, so that only two fingertips can fit between your skin and the top of the cuff Check 2 readings separated by at least one minute Keep a log of your BP readings For a visual, please reference this diagram: http://ccnc.care/bpdiagram  Provider Name: [facility]     Phone: [phone]  Zone 1: ALL CLEAR  Continue to monitor your symptoms:  BP reading is less than 140 (top number) or less than 90 (bottom   number)  No right upper stomach pain No headaches or seeing spots No feeling nauseated or throwing up No swelling in face and hands  Zone 2: CAUTION Call your doctor's office for any of the following:  BP reading is greater than 140 (top number) or greater than 90 (bottom number)  Stomach pain under your ribs in the middle or right side Headaches or seeing spots Feeling nauseated or throwing up Swelling in face and hands  Zone 3: EMERGENCY  Seek immediate medical care if you have any of the following:  BP reading is greater than160 (top number) or greater than  110 (bottom number) Severe headaches not improving with Tylenol Serious difficulty catching your breath Any worsening symptoms from Zone 2    First Trimester of Pregnancy The first trimester of pregnancy is from week 1 until the end of week 12 (months 1 through 3). A week after a sperm fertilizes an egg, the egg will implant on the wall of the uterus. This embryo will begin to develop into a baby. Genes from you and your partner are forming the baby. The female genes determine whether the baby is a boy or a girl. At 6-8 weeks, the eyes and face are formed, and the heartbeat can be seen on ultrasound. At the end of 12 weeks, all the baby's organs are formed.  Now that you are pregnant, you will want to do everything you can to have a healthy baby. Two of the most important things are to get good prenatal care and to follow your health care provider's instructions. Prenatal care is all the medical care you receive before the baby's birth. This care will help prevent, find, and treat any problems during the pregnancy and childbirth. BODY CHANGES Your body goes through many changes during pregnancy. The changes vary from woman to woman.  You may gain or lose a couple of pounds at first. You may feel sick to your stomach (nauseous) and throw up (vomit). If the vomiting is uncontrollable, call your health care provider. You may tire easily. You may develop headaches that can be relieved by medicines approved by your health care provider. You may urinate more often. Painful urination may mean you have a bladder infection. You may develop heartburn as a result of your pregnancy. You may develop constipation because certain hormones are causing the muscles that push waste through your intestines to slow down. You may develop hemorrhoids or swollen, bulging veins (varicose veins). Your breasts may begin to grow larger and become tender. Your nipples may stick out more, and the tissue that surrounds them  (areola) may become darker. Your gums may bleed and may be sensitive to brushing and flossing. Dark spots or blotches (chloasma, mask of pregnancy) may develop on your face. This will likely fade after the baby is born. Your menstrual periods will stop. You may have a loss of appetite. You may develop cravings for certain kinds of food. You may have changes in your emotions from day to day, such as being excited to be pregnant or being concerned that something may go wrong with the pregnancy and baby. You may have more vivid and strange dreams. You may have changes in your hair. These can include thickening of your hair, rapid growth, and changes in texture. Some women also have hair loss during or after pregnancy, or hair that feels dry or thin. Your hair will most likely return to normal after your baby is born. WHAT TO EXPECT AT YOUR PRENATAL   VISITS During a routine prenatal visit: You will be weighed to make sure you and the baby are growing normally. Your blood pressure will be taken. Your abdomen will be measured to track your baby's growth. The fetal heartbeat will be listened to starting around week 10 or 12 of your pregnancy. Test results from any previous visits will be discussed. Your health care provider may ask you: How you are feeling. If you are feeling the baby move. If you have had any abnormal symptoms, such as leaking fluid, bleeding, severe headaches, or abdominal cramping. If you have any questions. Other tests that may be performed during your first trimester include: Blood tests to find your blood type and to check for the presence of any previous infections. They will also be used to check for low iron levels (anemia) and Rh antibodies. Later in the pregnancy, blood tests for diabetes will be done along with other tests if problems develop. Urine tests to check for infections, diabetes, or protein in the urine. An ultrasound to confirm the proper growth and development  of the baby. An amniocentesis to check for possible genetic problems. Fetal screens for spina bifida and Down syndrome. You may need other tests to make sure you and the baby are doing well. HOME CARE INSTRUCTIONS  Medicines Follow your health care provider's instructions regarding medicine use. Specific medicines may be either safe or unsafe to take during pregnancy. Take your prenatal vitamins as directed. If you develop constipation, try taking a stool softener if your health care provider approves. Diet Eat regular, well-balanced meals. Choose a variety of foods, such as meat or vegetable-based protein, fish, milk and low-fat dairy products, vegetables, fruits, and whole grain breads and cereals. Your health care provider will help you determine the amount of weight gain that is right for you. Avoid raw meat and uncooked cheese. These carry germs that can cause birth defects in the baby. Eating four or five small meals rather than three large meals a day may help relieve nausea and vomiting. If you start to feel nauseous, eating a few soda crackers can be helpful. Drinking liquids between meals instead of during meals also seems to help nausea and vomiting. If you develop constipation, eat more high-fiber foods, such as fresh vegetables or fruit and whole grains. Drink enough fluids to keep your urine clear or pale yellow. Activity and Exercise Exercise only as directed by your health care provider. Exercising will help you: Control your weight. Stay in shape. Be prepared for labor and delivery. Experiencing pain or cramping in the lower abdomen or low back is a good sign that you should stop exercising. Check with your health care provider before continuing normal exercises. Try to avoid standing for long periods of time. Move your legs often if you must stand in one place for a long time. Avoid heavy lifting. Wear low-heeled shoes, and practice good posture. You may continue to have sex  unless your health care provider directs you otherwise. Relief of Pain or Discomfort Wear a good support bra for breast tenderness.   Take warm sitz baths to soothe any pain or discomfort caused by hemorrhoids. Use hemorrhoid cream if your health care provider approves.   Rest with your legs elevated if you have leg cramps or low back pain. If you develop varicose veins in your legs, wear support hose. Elevate your feet for 15 minutes, 3-4 times a day. Limit salt in your diet. Prenatal Care Schedule your prenatal visits by the   twelfth week of pregnancy. They are usually scheduled monthly at first, then more often in the last 2 months before delivery. Write down your questions. Take them to your prenatal visits. Keep all your prenatal visits as directed by your health care provider. Safety Wear your seat belt at all times when driving. Make a list of emergency phone numbers, including numbers for family, friends, the hospital, and police and fire departments. General Tips Ask your health care provider for a referral to a local prenatal education class. Begin classes no later than at the beginning of month 6 of your pregnancy. Ask for help if you have counseling or nutritional needs during pregnancy. Your health care provider can offer advice or refer you to specialists for help with various needs. Do not use hot tubs, steam rooms, or saunas. Do not douche or use tampons or scented sanitary pads. Do not cross your legs for long periods of time. Avoid cat litter boxes and soil used by cats. These carry germs that can cause birth defects in the baby and possibly loss of the fetus by miscarriage or stillbirth. Avoid all smoking, herbs, alcohol, and medicines not prescribed by your health care provider. Chemicals in these affect the formation and growth of the baby. Schedule a dentist appointment. At home, brush your teeth with a soft toothbrush and be gentle when you floss. SEEK MEDICAL CARE IF:   You have dizziness. You have mild pelvic cramps, pelvic pressure, or nagging pain in the abdominal area. You have persistent nausea, vomiting, or diarrhea. You have a bad smelling vaginal discharge. You have pain with urination. You notice increased swelling in your face, hands, legs, or ankles. SEEK IMMEDIATE MEDICAL CARE IF:  You have a fever. You are leaking fluid from your vagina. You have spotting or bleeding from your vagina. You have severe abdominal cramping or pain. You have rapid weight gain or loss. You vomit blood or material that looks like coffee grounds. You are exposed to German measles and have never had them. You are exposed to fifth disease or chickenpox. You develop a severe headache. You have shortness of breath. You have any kind of trauma, such as from a fall or a car accident. Document Released: 07/18/2001 Document Revised: 12/08/2013 Document Reviewed: 06/03/2013 [employer] Patient Information 2015 [employer]. This information is not intended to replace advice given to you by your health care provider. Make sure you discuss any questions you have with your health care provider.  

## 2024-07-24 LAB — CBC/D/PLT+RPR+RH+ABO+RUBIGG...
Antibody Screen: NEGATIVE
Basophils Absolute: 0 x10E3/uL (ref 0.0–0.2)
Basos: 0 %
EOS (ABSOLUTE): 0.1 x10E3/uL (ref 0.0–0.4)
Eos: 1 %
HCV Ab: NONREACTIVE
HIV Screen 4th Generation wRfx: NONREACTIVE
Hematocrit: 40.4 % (ref 34.0–46.6)
Hemoglobin: 13.4 g/dL (ref 11.1–15.9)
Hepatitis B Surface Ag: NEGATIVE
Immature Grans (Abs): 0 x10E3/uL (ref 0.0–0.1)
Immature Granulocytes: 0 %
Lymphocytes Absolute: 1.7 x10E3/uL (ref 0.7–3.1)
Lymphs: 22 %
MCH: 28.9 pg (ref 26.6–33.0)
MCHC: 33.2 g/dL (ref 31.5–35.7)
MCV: 87 fL (ref 79–97)
Monocytes Absolute: 0.5 x10E3/uL (ref 0.1–0.9)
Monocytes: 6 %
Neutrophils Absolute: 5.5 x10E3/uL (ref 1.4–7.0)
Neutrophils: 71 %
Platelets: 247 x10E3/uL (ref 150–450)
RBC: 4.64 x10E6/uL (ref 3.77–5.28)
RDW: 12.6 % (ref 11.7–15.4)
RPR Ser Ql: NONREACTIVE
Rh Factor: POSITIVE
Rubella Antibodies, IGG: 1.36 {index} (ref >0.99)
WBC: 7.9 x10E3/uL (ref 3.4–10.8)

## 2024-07-24 LAB — HCV INTERPRETATION

## 2024-07-24 LAB — HEMOGLOBIN A1C
Est. average glucose Bld gHb Est-mCnc: 97 mg/dL
Hgb A1c MFr Bld: 5 % (ref 4.8–5.6)

## 2024-07-24 LAB — CERVICOVAGINAL ANCILLARY ONLY
Chlamydia: NEGATIVE
Comment: NEGATIVE
Comment: NORMAL
Neisseria Gonorrhea: NEGATIVE

## 2024-07-25 LAB — URINE CULTURE

## 2024-07-29 ENCOUNTER — Ambulatory Visit: Payer: Self-pay | Admitting: Women's Health

## 2024-07-29 LAB — PANORAMA PRENATAL TEST FULL PANEL:PANORAMA TEST PLUS 5 ADDITIONAL MICRODELETIONS: FETAL FRACTION: 11.4

## 2024-08-02 LAB — HORIZON CUSTOM: REPORT SUMMARY: NEGATIVE

## 2024-08-20 ENCOUNTER — Encounter: Payer: Self-pay | Admitting: Women's Health

## 2024-08-20 ENCOUNTER — Ambulatory Visit (INDEPENDENT_AMBULATORY_CARE_PROVIDER_SITE_OTHER): Admitting: Women's Health

## 2024-08-20 VITALS — BP 100/64 | HR 84 | Wt 173.0 lb

## 2024-08-20 DIAGNOSIS — O99891 Other specified diseases and conditions complicating pregnancy: Secondary | ICD-10-CM

## 2024-08-20 DIAGNOSIS — Z3A16 16 weeks gestation of pregnancy: Secondary | ICD-10-CM

## 2024-08-20 DIAGNOSIS — R3989 Other symptoms and signs involving the genitourinary system: Secondary | ICD-10-CM | POA: Diagnosis not present

## 2024-08-20 DIAGNOSIS — Z1379 Encounter for other screening for genetic and chromosomal anomalies: Secondary | ICD-10-CM

## 2024-08-20 DIAGNOSIS — R8290 Unspecified abnormal findings in urine: Secondary | ICD-10-CM

## 2024-08-20 DIAGNOSIS — Z3480 Encounter for supervision of other normal pregnancy, unspecified trimester: Secondary | ICD-10-CM

## 2024-08-20 DIAGNOSIS — Z363 Encounter for antenatal screening for malformations: Secondary | ICD-10-CM

## 2024-08-20 DIAGNOSIS — Z3482 Encounter for supervision of other normal pregnancy, second trimester: Secondary | ICD-10-CM

## 2024-08-20 NOTE — Patient Instructions (Signed)
Stephanie Rich, thank you for choosing our office today! We appreciate the opportunity to meet your healthcare needs. You may receive a short survey by mail, e-mail, or through [employer]. If you are happy with your care we would appreciate if you could take just a few minutes to complete the survey questions. We read all of your comments and take your feedback very seriously. Thank you again for choosing our office.  Center for [employer] Team at [place] at [facility] ([place], [place] [address]) Entrance C, located off of [address] parking  Go to [email or web address] to register for FREE online childbirth classes  Call the office [phone]) or go to [facility] if: You begin to severe cramping Your water breaks.  Sometimes it is a big gush of fluid, sometimes it is just a trickle that keeps getting your panties wet or running down your legs You have vaginal bleeding.  It is normal to have a small amount of spotting if your cervix was checked.   [facility] Pediatricians/Family Doctors [facility]): [address] [place], [facility]: [address] [place], [phone]                [facility]): [place], [phone] (call to ask if accepting patients) [facility] Department: [address], [place], [place] Pediatricians/Family Doctors [facility]): [place] [place], [address], [place] [facility]: [address] [place], [place] [facility]: [place], [place]): [phone] [facility]: [address] [place]: [address] [place] [facility]: [phone], [phone]  Home Blood Pressure Monitoring for Patients   Your provider has recommended that you check your blood pressure (BP) at least once a week at home. If you do not have a blood pressure cuff at home, one will be provided for you. Contact your provider if you have not received your monitor within 1 week.   Helpful Tips for Accurate Home Blood Pressure Checks  Don't smoke, exercise, or drink caffeine 30 minutes before checking your BP Use the restroom before checking your BP (a full bladder can raise your pressure) Relax in a comfortable upright chair Feet on the ground Left arm resting comfortably on a flat surface at the level of your heart Legs uncrossed Back supported Sit quietly and don't talk Place the cuff on your bare arm Adjust snuggly, so that only two fingertips can fit between your skin and the top of the cuff Check 2 readings separated by at least one minute Keep a log of your BP readings For a visual, please reference this diagram: http://ccnc.care/bpdiagram  Provider Name: [facility]     Phone: [phone]  Zone 1: ALL CLEAR  Continue to monitor your symptoms:  BP reading is less than 140 (top number) or less than 90 (bottom number)  No right upper stomach pain No headaches or seeing spots No feeling nauseated or throwing up No swelling in face and hands  Zone 2: CAUTION Call your doctor's office for any of the following:  BP reading is greater than 140 (top number) or greater than  90 (bottom number)  Stomach pain under your ribs in the middle or right side Headaches or seeing spots Feeling nauseated or throwing up Swelling in face and hands  Zone 3: EMERGENCY  Seek immediate medical care if you have any of the following:  BP reading is greater than160 (top number) or greater than 110 (bottom number) Severe headaches not improving with Tylenol Serious difficulty catching your breath Any worsening symptoms from  Zone 2     Second Trimester of Pregnancy The second trimester is from week 14 through week 27 (months 4 through 6). The second trimester is often a time when you feel your best. Your body has adjusted to being pregnant, and you begin to feel better physically. Usually, morning sickness has lessened or quit completely, you may have more energy, and you may have an increase in appetite. The second trimester is also a time when the fetus is growing rapidly. At the end of the sixth month, the fetus is about 9 inches long and weighs about 1 pounds. You will likely begin to feel the baby move (quickening) between 16 and 20 weeks of pregnancy. Body changes during your second trimester Your body continues to go through many changes during your second trimester. The changes vary from woman to woman. Your weight will continue to increase. You will notice your lower abdomen bulging out. You may begin to get stretch marks on your hips, abdomen, and breasts. You may develop headaches that can be relieved by medicines. The medicines should be approved by your health care provider. You may urinate more often because the fetus is pressing on your bladder. You may develop or continue to have heartburn as a result of your pregnancy. You may develop constipation because certain hormones are causing the muscles that push waste through your intestines to slow down. You may develop hemorrhoids or swollen, bulging veins (varicose veins). You may have back pain. This is caused by: Weight gain. Pregnancy hormones that are relaxing the joints in your pelvis. A shift in weight and the muscles that support your balance. Your breasts will continue to grow and they will continue to become tender. Your gums may bleed and may be sensitive to brushing and flossing. Dark spots or blotches (chloasma, mask of pregnancy) may develop on your face. This will likely fade after the baby is born. A dark line from your belly button to  the pubic area (linea nigra) may appear. This will likely fade after the baby is born. You may have changes in your hair. These can include thickening of your hair, rapid growth, and changes in texture. Some women also have hair loss during or after pregnancy, or hair that feels dry or thin. Your hair will most likely return to normal after your baby is born.  What to expect at prenatal visits During a routine prenatal visit: You will be weighed to make sure you and the fetus are growing normally. Your blood pressure will be taken. Your abdomen will be measured to track your baby's growth. The fetal heartbeat will be listened to. Any test results from the previous visit will be discussed.  Your health care provider may ask you: How you are feeling. If you are feeling the baby move. If you have had any abnormal symptoms, such as leaking fluid, bleeding, severe headaches, or abdominal cramping. If you are using any tobacco products, including cigarettes, chewing tobacco, and electronic cigarettes. If you have any questions.  Other tests that may be performed during   your second trimester include: Blood tests that check for: Low iron levels (anemia). High blood sugar that affects pregnant women (gestational diabetes) between 24 and 28 weeks. Rh antibodies. This is to check for a protein on red blood cells (Rh factor). Urine tests to check for infections, diabetes, or protein in the urine. An ultrasound to confirm the proper growth and development of the baby. An amniocentesis to check for possible genetic problems. Fetal screens for spina bifida and Down syndrome. HIV (human immunodeficiency virus) testing. Routine prenatal testing includes screening for HIV, unless you choose not to have this test.  Follow these instructions at home: Medicines Follow your health care provider's instructions regarding medicine use. Specific medicines may be either safe or unsafe to take during  pregnancy. Take a prenatal vitamin that contains at least 600 micrograms (mcg) of folic acid. If you develop constipation, try taking a stool softener if your health care provider approves. Eating and drinking Eat a balanced diet that includes fresh fruits and vegetables, whole grains, good sources of protein such as meat, eggs, or tofu, and low-fat dairy. Your health care provider will help you determine the amount of weight gain that is right for you. Avoid raw meat and uncooked cheese. These carry germs that can cause birth defects in the baby. If you have low calcium intake from food, talk to your health care provider about whether you should take a daily calcium supplement. Limit foods that are high in fat and processed sugars, such as fried and sweet foods. To prevent constipation: Drink enough fluid to keep your urine clear or pale yellow. Eat foods that are high in fiber, such as fresh fruits and vegetables, whole grains, and beans. Activity Exercise only as directed by your health care provider. Most women can continue their usual exercise routine during pregnancy. Try to exercise for 30 minutes at least 5 days a week. Stop exercising if you experience uterine contractions. Avoid heavy lifting, wear low heel shoes, and practice good posture. A sexual relationship may be continued unless your health care provider directs you otherwise. Relieving pain and discomfort Wear a good support bra to prevent discomfort from breast tenderness. Take warm sitz baths to soothe any pain or discomfort caused by hemorrhoids. Use hemorrhoid cream if your health care provider approves. Rest with your legs elevated if you have leg cramps or low back pain. If you develop varicose veins, wear support hose. Elevate your feet for 15 minutes, 3-4 times a day. Limit salt in your diet. Prenatal Care Write down your questions. Take them to your prenatal visits. Keep all your prenatal visits as told by your health  care provider. This is important. Safety Wear your seat belt at all times when driving. Make a list of emergency phone numbers, including numbers for family, friends, the hospital, and police and fire departments. General instructions Ask your health care provider for a referral to a local prenatal education class. Begin classes no later than the beginning of month 6 of your pregnancy. Ask for help if you have counseling or nutritional needs during pregnancy. Your health care provider can offer advice or refer you to specialists for help with various needs. Do not use hot tubs, steam rooms, or saunas. Do not douche or use tampons or scented sanitary pads. Do not cross your legs for long periods of time. Avoid cat litter boxes and soil used by cats. These carry germs that can cause birth defects in the baby and possibly loss of the   fetus by miscarriage or stillbirth. Avoid all smoking, herbs, alcohol, and unprescribed drugs. Chemicals in these products can affect the formation and growth of the baby. Do not use any products that contain nicotine or tobacco, such as cigarettes and e-cigarettes. If you need help quitting, ask your health care provider. Visit your dentist if you have not gone yet during your pregnancy. Use a soft toothbrush to brush your teeth and be gentle when you floss. Contact a health care provider if: You have dizziness. You have mild pelvic cramps, pelvic pressure, or nagging pain in the abdominal area. You have persistent nausea, vomiting, or diarrhea. You have a bad smelling vaginal discharge. You have pain when you urinate. Get help right away if: You have a fever. You are leaking fluid from your vagina. You have spotting or bleeding from your vagina. You have severe abdominal cramping or pain. You have rapid weight gain or weight loss. You have shortness of breath with chest pain. You notice sudden or extreme swelling of your face, hands, ankles, feet, or legs. You  have not felt your baby move in over an hour. You have severe headaches that do not go away when you take medicine. You have vision changes. Summary The second trimester is from week 14 through week 27 (months 4 through 6). It is also a time when the fetus is growing rapidly. Your body goes through many changes during pregnancy. The changes vary from woman to woman. Avoid all smoking, herbs, alcohol, and unprescribed drugs. These chemicals affect the formation and growth your baby. Do not use any tobacco products, such as cigarettes, chewing tobacco, and e-cigarettes. If you need help quitting, ask your health care provider. Contact your health care provider if you have any questions. Keep all prenatal visits as told by your health care provider. This is important. This information is not intended to replace advice given to you by your health care provider. Make sure you discuss any questions you have with your health care provider. Document Released: 07/18/2001 Document Revised: 12/30/2015 Document Reviewed: 09/24/2012 Elsevier Interactive Patient Education  2017 Elsevier Inc.  

## 2024-08-20 NOTE — Progress Notes (Signed)
 "   LOW-RISK PREGNANCY VISIT Patient name: Stephanie Rich MRN [id number]  Date of birth: 24-Jun-2001 Chief Complaint:   Routine Prenatal Visit (AFP)  History of Present Illness:   Stephanie Rich is a 24 y.o. G59P0010 female at [redacted]w[redacted]d with an Estimated Date of Delivery: 01/29/25 being seen today for ongoing management of a low-risk pregnancy.   Today she reports strong odor to urine, no UTI sx. Contractions: Not present.  .  Movement: Absent. denies leaking of fluid.     07/23/2024    9:44 AM 04/07/2022    9:48 AM 08/15/2016    3:53 PM 05/15/2016    4:08 PM  Depression screen PHQ 2/9  Decreased Interest 0 1 0 0  Down, Depressed, Hopeless 0 0 0 0  PHQ - 2 Score 0 1 0 0  Altered sleeping 0 1 0 0  Tired, decreased energy 0 2 0 0  Change in appetite 0 1 0 0  Feeling bad or failure about yourself  0 0 0 0  Trouble concentrating 0 1 0 0  Moving slowly or fidgety/restless 0 0 0 0  Suicidal thoughts 0 0 0  0   PHQ-9 Score 0 6  0  0      Data saved with a previous flowsheet row definition        07/23/2024    9:44 AM 04/07/2022    9:48 AM  GAD 7 : Generalized Anxiety Score  Nervous, Anxious, on Edge 0 1  Control/stop worrying 0 1  Worry too much - different things 0 1  Trouble relaxing 0 1  Restless 0 1  Easily annoyed or irritable 0 1  Afraid - awful might happen 0 0  Total GAD 7 Score 0 6      Review of Systems:   Pertinent items are noted in HPI Denies abnormal vaginal discharge w/ itching/odor/irritation, headaches, visual changes, shortness of breath, chest pain, abdominal pain, severe nausea/vomiting, or problems with urination or bowel movements unless otherwise stated above. Pertinent History Reviewed:  Reviewed past medical,surgical, social, obstetrical and family history.  Reviewed problem list, medications and allergies. Physical Assessment:   Vitals:   08/20/24 0922  BP: 100/64  Pulse: 84  Weight: 173 lb (78.5 kg)  Body mass index is 28.79 kg/m.         Physical Examination:   General appearance: Well appearing, and in no distress  Mental status: Alert, oriented to person, place, and time  Skin: Warm & dry  Cardiovascular: Normal heart rate noted  Respiratory: Normal respiratory effort, no distress  Abdomen: Soft, gravid, nontender  Pelvic: Cervical exam deferred         Extremities: Edema: None  Fetal Status: Fetal Heart Rate (bpm): 153   Movement: Absent    Chaperone: N/A No results found for this or any previous visit (from the past 24 hours).  Assessment & Plan:  1) Low-risk pregnancy G2P0010 at [redacted]w[redacted]d with an Estimated Date of Delivery: 01/29/25   2) Strong odor to urine, no UTI sx, send urine cx   Meds: No orders of the defined types were placed in this encounter.  Labs/procedures today: urine culture and AFP  Plan:  Continue routine obstetrical care  Next visit: prefers will be in person for u/s    Reviewed: Preterm labor symptoms and general obstetric precautions including but not limited to vaginal bleeding, contractions, leaking of fluid and fetal movement were reviewed in detail with the patient.  All questions were answered.  Does have home bp cuff. Office bp cuff given: not applicable. Check bp weekly, let us  know if consistently >140 and/or >90.  Follow-up: Return for As scheduled.  Future Appointments  Date Time Provider Department Center  09/12/2024 10:45 AM [facility] US  [facility] None  09/12/2024 11:30 AM Ozan, Jennifer, DO [facility]    Orders Placed This Encounter  Procedures   Urine Culture   US  OB Comp + 14 Wk   AFP, Serum, Open Spina Bifida   Stephanie Rich CNM, [facility] 08/20/2024 9:51 AM  "

## 2024-08-22 LAB — URINE CULTURE

## 2024-08-23 LAB — AFP, SERUM, OPEN SPINA BIFIDA
AFP MoM: 1.05
AFP Value: 31.4 ng/mL
Gest. Age on Collection Date: 16 wk
Maternal Age At EDD: 24.8 a
OSBR Risk 1 IN: 10000
Test Results:: NEGATIVE
Weight: 173 [lb_av]

## 2024-08-25 ENCOUNTER — Ambulatory Visit: Payer: Self-pay | Admitting: Women's Health

## 2024-09-12 ENCOUNTER — Other Ambulatory Visit

## 2024-09-12 ENCOUNTER — Encounter: Admitting: Obstetrics & Gynecology

## 2024-09-26 ENCOUNTER — Encounter: Admitting: Obstetrics & Gynecology

## 2024-09-26 ENCOUNTER — Other Ambulatory Visit
